# Patient Record
Sex: Female | Born: 1995 | Race: White | Hispanic: No | Marital: Single | Smoking: Never smoker
Health system: Southern US, Community
[De-identification: ages and names within clinical notes are randomized; demographics above are authoritative.]

## PROBLEM LIST (undated history)

## (undated) DIAGNOSIS — E282 Polycystic ovarian syndrome: Secondary | ICD-10-CM

## (undated) DIAGNOSIS — K219 Gastro-esophageal reflux disease without esophagitis: Secondary | ICD-10-CM

## (undated) HISTORY — PX: WISDOM TOOTH EXTRACTION: SHX21

---
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
## 2017-01-14 IMAGING — CT CT ABD-PELV W/ CM
2 of 4 series · 17 of 46 positions shown, 19 images · IV contrast (iopamidol)
Comparison: None.

CLINICAL DATA: Left lower quadrant pain 4 days.  Nausea.

EXAM:
CT ABDOMEN AND PELVIS WITH CONTRAST
TECHNIQUE: Multidetector CT imaging of the abdomen and pelvis was performed
using the standard protocol following bolus administration of
intravenous contrast.
CONTRAST:  100mL 2Y2MDU-BII IOPAMIDOL (2Y2MDU-BII) INJECTION 61%

[Series 2: routine abd pel with · axial · 0.88mm/px · z∈[-256,+224]mm · 14 of 106 slices shown, 16 images]
[im 5/106  soft-tissue]
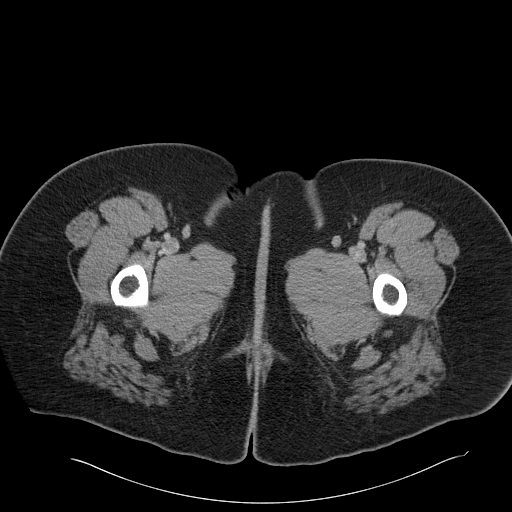
[im 5/106  bone]
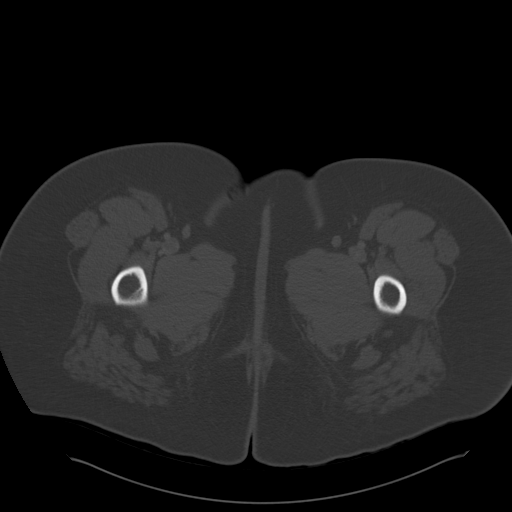
[im 13/106  soft-tissue]
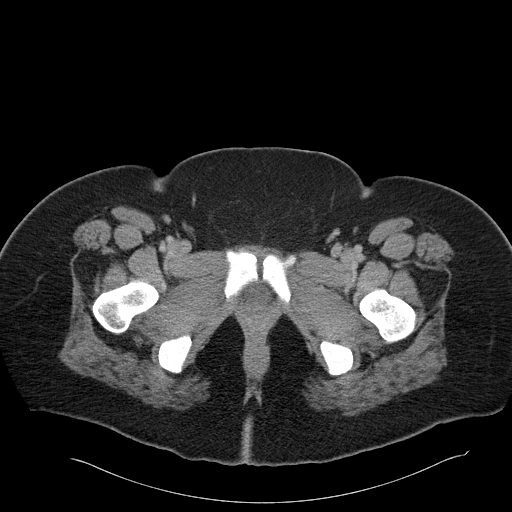
[im 22/106  soft-tissue]
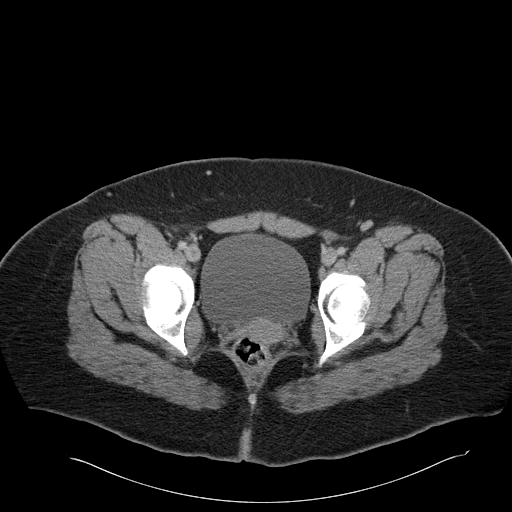
[im 30/106  soft-tissue]
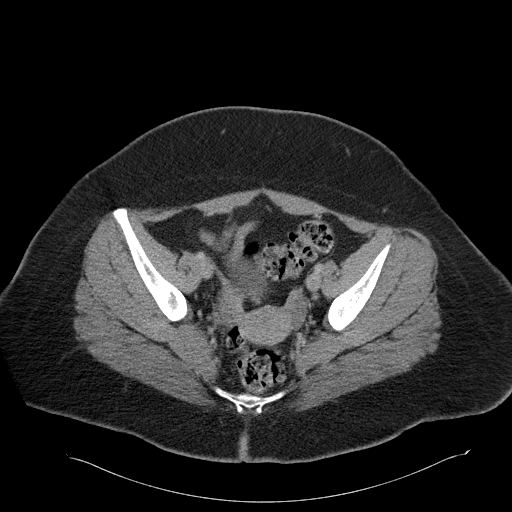
[im 34/106  soft-tissue]
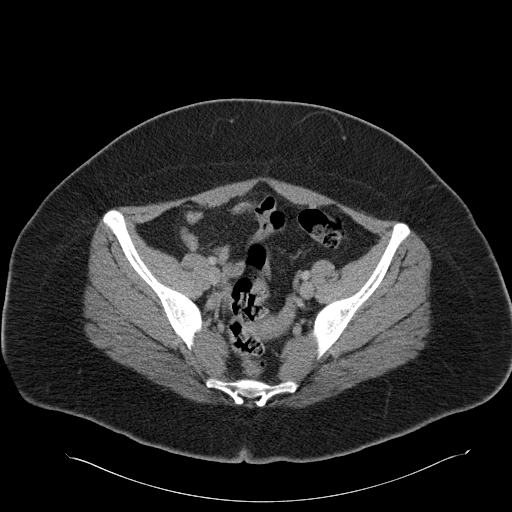
[im 43/106  soft-tissue]
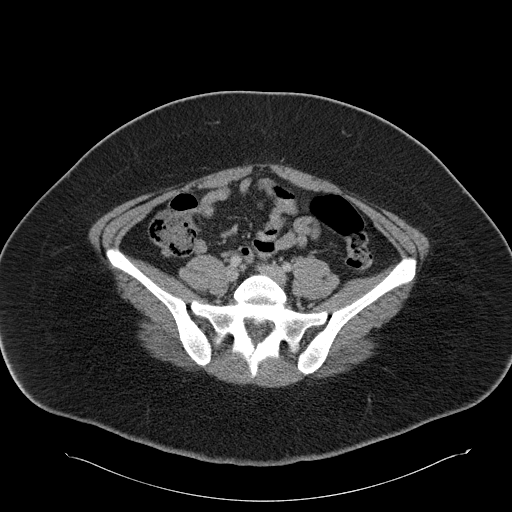
[im 51/106  soft-tissue]
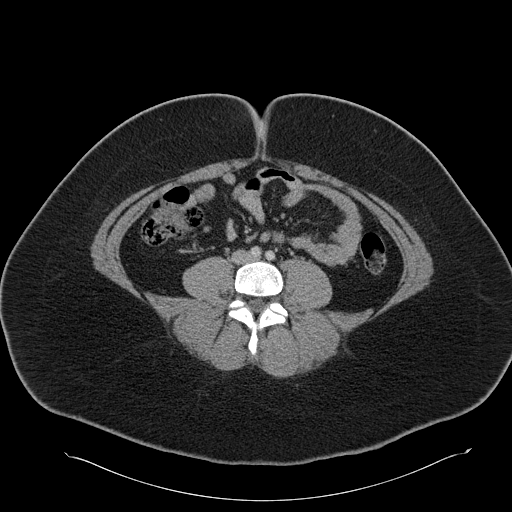
[im 55/106  soft-tissue]
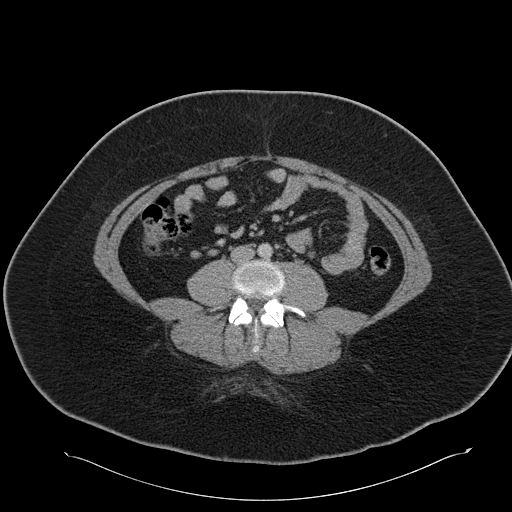
[im 64/106  soft-tissue]
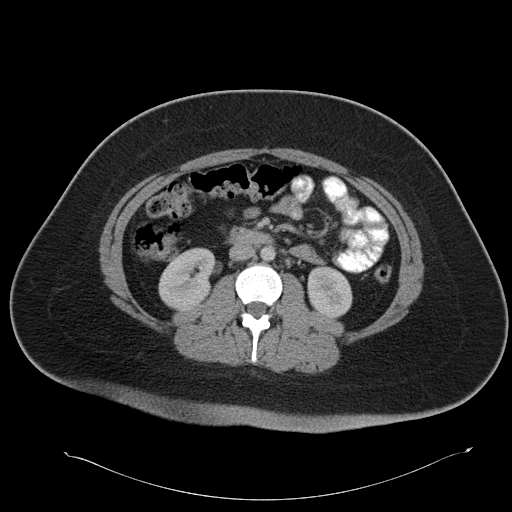
[im 64/106  bone]
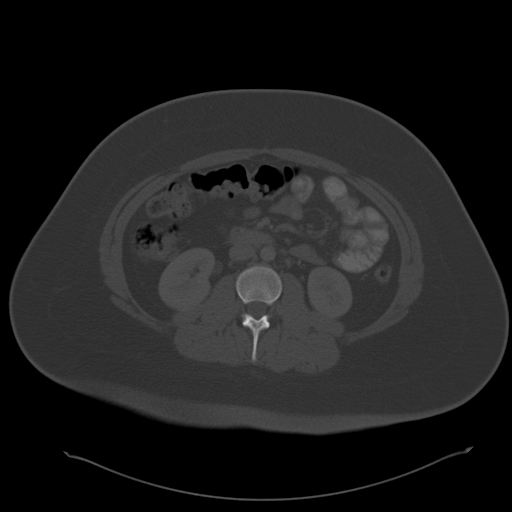
[im 72/106  soft-tissue]
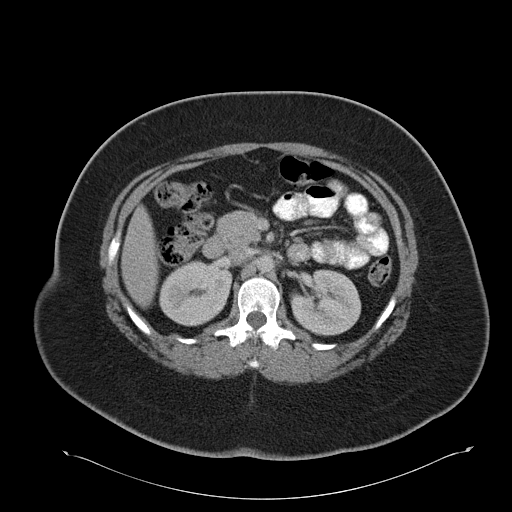
[im 80/106  soft-tissue]
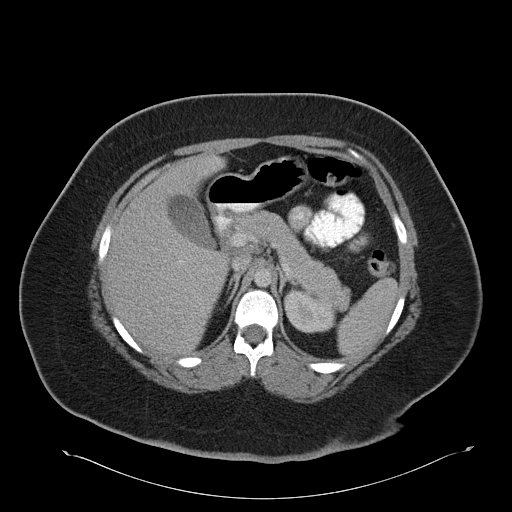
[im 85/106  soft-tissue]
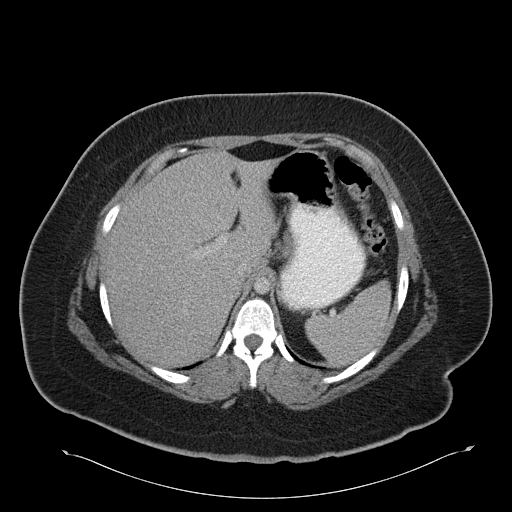
[im 93/106  soft-tissue]
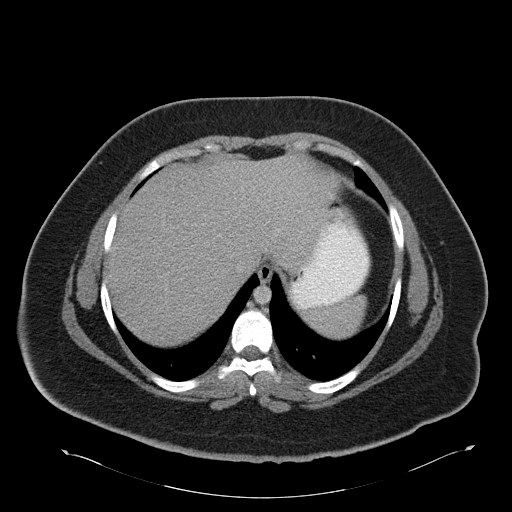
[im 101/106  soft-tissue]
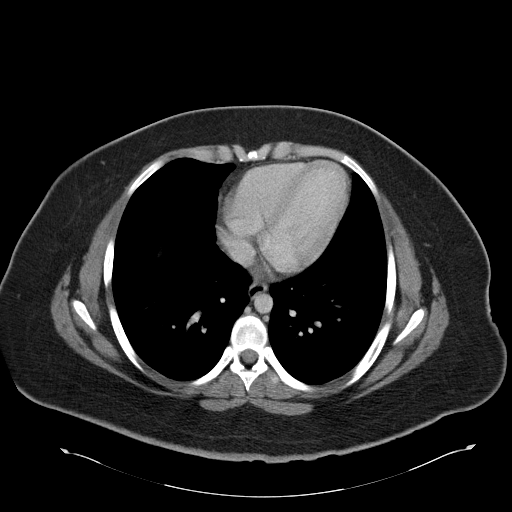

[Series 5: cor routine abd pel with · coronal · 1.09mm/px · 3 of 145 slices shown]
[im 49/145  soft-tissue]
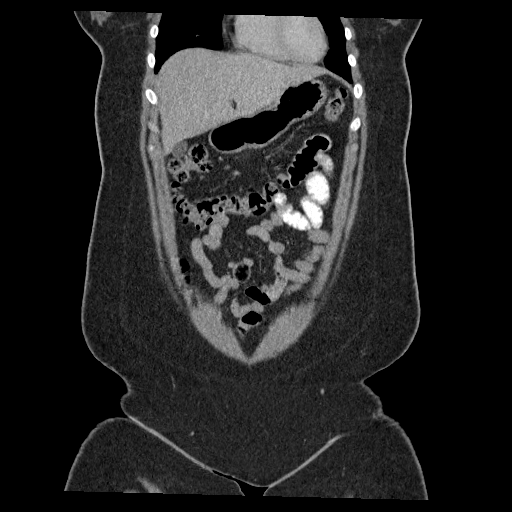
[im 65/145  soft-tissue]
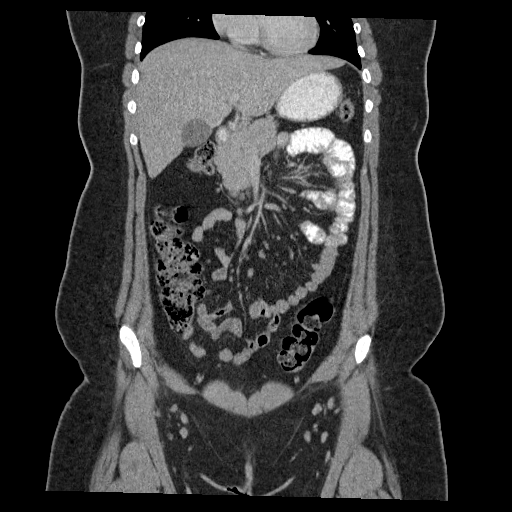
[im 81/145  soft-tissue]
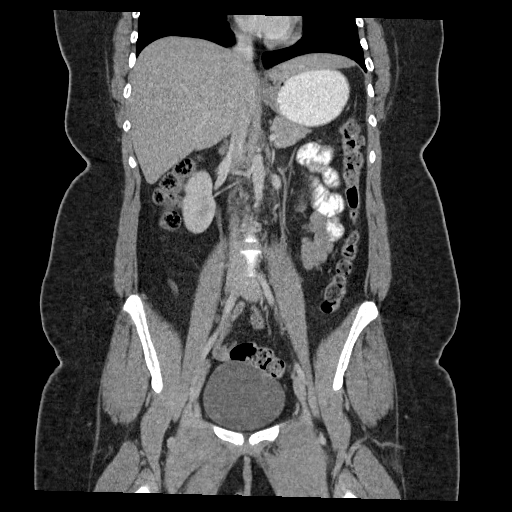

[17 of 46 positions shown; findings below may reference images not displayed]

FINDINGS: Lung bases are within normal.

Abdominal images demonstrate a normal liver, spleen, pancreas,
gallbladder and adrenal glands.

Kidneys are normal in size without hydronephrosis or
nephrolithiasis. Ureters are normal.

Vascular structures are within normal.

Stomach is normal. Appendix is normal. Small bowel and colon are
normal. Mesenteries normal.

Pelvic images demonstrate a normal uterus, ovaries, bladder and
rectum. Remaining bones and soft tissues within normal.
IMPRESSION: No acute findings in the abdomen/pelvis.

## 2017-06-14 DIAGNOSIS — J0391 Acute recurrent tonsillitis, unspecified: Secondary | ICD-10-CM | POA: Insufficient documentation

## 2017-08-14 DIAGNOSIS — F4310 Post-traumatic stress disorder, unspecified: Secondary | ICD-10-CM | POA: Insufficient documentation

## 2017-08-14 DIAGNOSIS — K219 Gastro-esophageal reflux disease without esophagitis: Secondary | ICD-10-CM | POA: Insufficient documentation

## 2018-11-23 DIAGNOSIS — F330 Major depressive disorder, recurrent, mild: Secondary | ICD-10-CM | POA: Insufficient documentation

## 2018-11-23 DIAGNOSIS — F411 Generalized anxiety disorder: Secondary | ICD-10-CM | POA: Insufficient documentation

## 2019-06-25 DIAGNOSIS — E559 Vitamin D deficiency, unspecified: Secondary | ICD-10-CM | POA: Insufficient documentation

## 2019-07-25 DIAGNOSIS — F3161 Bipolar disorder, current episode mixed, mild: Secondary | ICD-10-CM | POA: Insufficient documentation

## 2019-08-20 DIAGNOSIS — E88819 Insulin resistance, unspecified: Secondary | ICD-10-CM | POA: Insufficient documentation

## 2021-08-23 DIAGNOSIS — Z9884 Bariatric surgery status: Secondary | ICD-10-CM | POA: Insufficient documentation

## 2021-09-15 ENCOUNTER — Encounter (HOSPITAL_BASED_OUTPATIENT_CLINIC_OR_DEPARTMENT_OTHER): Payer: Self-pay | Admitting: Emergency Medicine

## 2021-09-15 ENCOUNTER — Other Ambulatory Visit: Payer: Self-pay

## 2021-09-15 DIAGNOSIS — W268XXA Contact with other sharp object(s), not elsewhere classified, initial encounter: Secondary | ICD-10-CM | POA: Diagnosis not present

## 2021-09-15 DIAGNOSIS — Z91040 Latex allergy status: Secondary | ICD-10-CM | POA: Insufficient documentation

## 2021-09-15 DIAGNOSIS — S91115A Laceration without foreign body of left lesser toe(s) without damage to nail, initial encounter: Secondary | ICD-10-CM | POA: Diagnosis not present

## 2021-09-15 DIAGNOSIS — S99922A Unspecified injury of left foot, initial encounter: Secondary | ICD-10-CM | POA: Diagnosis present

## 2021-09-15 NOTE — ED Triage Notes (Signed)
Patient dropped a kitchen blade on her foot, lacerating her left fourth toe.  Bleeding controlled with gauze.  ?

## 2021-09-16 ENCOUNTER — Emergency Department (HOSPITAL_BASED_OUTPATIENT_CLINIC_OR_DEPARTMENT_OTHER)
Admission: EM | Admit: 2021-09-16 | Discharge: 2021-09-16 | Disposition: A | Discharge status: Home / Self Care | Payer: BLUE CROSS/BLUE SHIELD | Attending: Emergency Medicine | Admitting: Emergency Medicine

## 2021-09-16 DIAGNOSIS — S91116A Laceration without foreign body of unspecified lesser toe(s) without damage to nail, initial encounter: Secondary | ICD-10-CM

## 2021-09-16 NOTE — ED Notes (Signed)
Patient's foot currently soaking in half saline, half iodine per MD Palumbo.  Dermabond at bedside. ?

## 2021-09-16 NOTE — ED Provider Notes (Signed)
?[facility] EMERGENCY DEPT ?Provider Note ? ? ?CSN: [id number] ?Arrival date & time: 09/15/21  2142 ? ?  ? ?History ? ?Chief Complaint  ?Patient presents with  ? Laceration  ? ? ?April Shea is a 26 y.o. female. ? ?The history is provided by the patient.  ?Laceration ?Location:  Toe ?Toe laceration location:  L fourth toe ?Length:  0.9 ?Depth:  Cutaneous ?Quality: straight   ?Bleeding: controlled   ?Time since incident:  4 hours ?Laceration mechanism:  Metal edge ?Pain details:  ?  Quality:  Aching ?  Severity:  Mild ?  Timing:  Constant ?  Progression:  Unchanged ?Foreign body present:  No foreign bodies ?Relieved by:  Nothing ?Worsened by:  Nothing ?Ineffective treatments:  None tried ?Tetanus status:  Up to date ?Associated symptoms: no fever   ? ?  ? ?Home Medications ?Prior to Admission medications   ?Not on File  ?   ? ?Allergies    ?Latex   ? ?Review of Systems   ?Review of Systems  ?Constitutional:  Negative for fever.  ?HENT:  Negative for facial swelling.   ?Eyes:  Negative for redness.  ?Respiratory:  Negative for wheezing and stridor.   ?Cardiovascular:  Negative for chest pain.  ?Gastrointestinal:  Negative for abdominal pain.  ?Genitourinary:  Negative for difficulty urinating.  ?Musculoskeletal:  Negative for neck stiffness.  ?Skin:  Negative for color change.  ?Neurological:  Negative for facial asymmetry.  ?Psychiatric/Behavioral:  Negative for agitation.   ?All other systems reviewed and are negative. ? ?Physical Exam ?Updated Vital Signs ?BP 130/80 (BP Location: Right Arm)   Pulse 74   Temp 98.4 ?F (36.9 ?C) (Oral)   Resp 18   Ht 5\' 7"  (1.702 m)   Wt 117 kg   SpO2 99%   BMI 40.41 kg/m?  ?Physical Exam ?Vitals and nursing note reviewed.  ?Constitutional:   ?   General: She is not in acute distress. ?   Appearance: Normal appearance.  ?HENT:  ?   Head: Normocephalic and atraumatic.  ?   Nose: Nose normal.  ?Eyes:  ?   Conjunctiva/sclera: Conjunctivae normal.  ?   Pupils: Pupils  are equal, round, and reactive to light.  ?Cardiovascular:  ?   Rate and Rhythm: Normal rate and regular rhythm.  ?   Pulses: Normal pulses.  ?   Heart sounds: Normal heart sounds.  ?Pulmonary:  ?   Effort: Pulmonary effort is normal.  ?   Breath sounds: Normal breath sounds.  ?Abdominal:  ?   General: Bowel sounds are normal.  ?   Palpations: Abdomen is soft.  ?   Tenderness: There is no abdominal tenderness. There is no guarding.  ?Musculoskeletal:     ?   General: Normal range of motion.  ?   Cervical back: Normal range of motion and neck supple.  ?Skin: ?   General: Skin is warm and dry.  ?   Capillary Refill: Capillary refill takes less than 2 seconds.  ?Neurological:  ?   General: No focal deficit present.  ?   Mental Status: She is alert and oriented to person, place, and time.  ?   Deep Tendon Reflexes: Reflexes normal.  ?Psychiatric:     ?   Mood and Affect: Mood normal.     ?   Behavior: Behavior normal.  ? ? ?ED Results / Procedures / Treatments   ?Labs ?(all labs ordered are listed, but only abnormal results are displayed) ?Labs Reviewed -  No data to display ? ?EKG ?None ? ?Radiology ?No results found. ? ?Procedures ?Marland Kitchen.Laceration Repair ? ?Date/Time: 09/16/2021 1:51 AM ?Performed by: Cy Blamer, MD ?Authorized by: Cy Blamer, MD  ? ?Consent:  ?  Consent obtained:  Verbal ?  Consent given by:  Patient ?  Risks discussed:  Infection, need for additional repair, nerve damage, pain, poor cosmetic result and poor wound healing ?  Alternatives discussed:  No treatment ?Universal protocol:  ?  Procedure explained and questions answered to patient or proxy's satisfaction: yes   ?  Patient identity confirmed:  Arm band ?Anesthesia:  ?  Anesthesia method:  None ?Laceration details:  ?  Location:  Toe ?  Toe location:  L fourth toe ?  Length (cm):  0.9 ?  Depth (mm):  0.5 ?Pre-procedure details:  ?  Preparation:  Patient was prepped and draped in usual sterile fashion ?Exploration:  ?  Hemostasis achieved  with:  Cautery ?  Imaging outcome: foreign body noted   ?  Wound exploration: wound explored through full range of motion   ?  Wound extent: no areolar tissue violation noted   ?Treatment:  ?  Area cleansed with:  Povidone-iodine, chlorhexidine and saline ?  Amount of cleaning:  Extensive ?  Irrigation solution:  Sterile saline ?  Irrigation method:  Syringe ?  Debridement:  None ?Skin repair:  ?  Repair method:  Tissue adhesive ?Approximation:  ?  Approximation:  Close ?Repair type:  ?  Repair type:  Simple ?Post-procedure details:  ?  Dressing:  Sterile dressing ?  Procedure completion:  Tolerated well, no immediate complications  ? ? ?Medications Ordered in ED ?Medications - No data to display ? ?ED Course/ Medical Decision Making/ A&P ?  ?                        ?Medical Decision Making ?Laceration to the left 4th toe on metal blade.  Tatenus UTD ? ?Amount and/or Complexity of Data Reviewed ?Independent Historian: spouse ?   Details: see above ? ?Risk ?Prescription drug management. ?Risk Details: Laceration repaired no submersion for 2 weeks.   ? ? ? ?Final Clinical Impression(s) / ED Diagnoses ?Final diagnoses:  ?None  ? ?Return for intractable cough, coughing up blood, fevers > 100.4 unrelieved by medication, shortness of breath, intractable vomiting, chest pain, shortness of breath, weakness, numbness, changes in speech, facial asymmetry, abdominal pain, passing out, Inability to tolerate liquids or food, cough, altered mental status or any concerns. No signs of systemic illness or infection. The patient is nontoxic-appearing on exam and vital signs are within normal limits.  ?I have reviewed the triage vital signs and the nursing notes. Pertinent labs & imaging results that were available during my care of the patient were reviewed by me and considered in my medical decision making (see chart for details). After history, exam, and medical workup I feel the patient has been appropriately medically screened  and is safe for discharge home. Pertinent diagnoses were discussed with the patient. Patient was given return precautions.  ?Rx / DC Orders ?ED Discharge Orders   ? ? None  ? ?  ? ? ?  ?Melvina Pangelinan, MD ?09/16/21 0153 ? ?

## 2021-09-16 NOTE — ED Notes (Signed)
Patient's wound dressed with bulky dressing.  ?

## 2021-11-09 DIAGNOSIS — F419 Anxiety disorder, unspecified: Secondary | ICD-10-CM | POA: Insufficient documentation

## 2021-11-09 DIAGNOSIS — Z975 Presence of (intrauterine) contraceptive device: Secondary | ICD-10-CM | POA: Insufficient documentation

## 2021-11-09 DIAGNOSIS — N921 Excessive and frequent menstruation with irregular cycle: Secondary | ICD-10-CM | POA: Insufficient documentation

## 2022-04-18 ENCOUNTER — Ambulatory Visit (INDEPENDENT_AMBULATORY_CARE_PROVIDER_SITE_OTHER): Payer: BC Managed Care – PPO | Admitting: Podiatry

## 2022-04-18 DIAGNOSIS — B070 Plantar wart: Secondary | ICD-10-CM

## 2022-04-18 NOTE — Progress Notes (Signed)
   Chief Complaint  Patient presents with   Plantar Warts    Patient is here for left foot plantar wart.the patient states that she has had for 13 years.    Subjective: 26 y.o. female presenting today as a new patient for evaluation of plantar verruca to the left forefoot x13 years.  Patient states that she has been seen by her primary care and dermatology and they have tried freezing it off multiple occasions with no improvement.  She presents for further treatment and evaluation   Past Medical History:  Diagnosis Date   Acid reflux    PCOS (polycystic ovarian syndrome)     Objective: Physical Exam General: The patient is alert and oriented x3 in no acute distress.   Dermatology: Hyperkeratotic skin lesion(s) noted to the plantar aspect of the left foot approximately 1 cm in diameter. Pinpoint bleeding noted upon debridement. Skin is warm, dry and supple bilateral lower extremities. Negative for open lesions or macerations.   Vascular: Palpable pedal pulses bilaterally. No edema or erythema noted. Capillary refill within normal limits.   Neurological: Epicritic and protective threshold grossly intact bilaterally.    Musculoskeletal Exam: Pain on palpation to the noted skin lesion(s).  Range of motion within normal limits to all pedal and ankle joints bilateral. Muscle strength 5/5 in all groups bilateral.    Assessment: #1 plantar wart left foot   Plan of Care:  #1 Patient was evaluated. #2 Excisional debridement of the plantar wart lesion(s) was performed using a chisel blade. Cantharone was applied and the lesion(s) was dressed with a dry sterile dressing. #3 patient is to return to clinic in 2 weeks  *Mental health therapist  Edrick Kins, DPM [facility]  Dr. Edrick Kins, DPM    [address], [place] [address]                Office [phone]  Fax [phone]

## 2022-05-11 ENCOUNTER — Encounter: Payer: Self-pay | Admitting: Podiatry

## 2022-05-11 ENCOUNTER — Ambulatory Visit (INDEPENDENT_AMBULATORY_CARE_PROVIDER_SITE_OTHER): Payer: BC Managed Care – PPO | Admitting: Podiatry

## 2022-05-11 DIAGNOSIS — B070 Plantar wart: Secondary | ICD-10-CM | POA: Diagnosis not present

## 2022-05-11 DIAGNOSIS — Z6841 Body Mass Index (BMI) 40.0 and over, adult: Secondary | ICD-10-CM | POA: Insufficient documentation

## 2022-05-11 NOTE — Progress Notes (Signed)
   Chief Complaint  Patient presents with   Plantar Warts    Subjective: 26 y.o. female presenting today for follow-up evaluation of plantar verruca to the left forefoot x13 years.  Patient states that she has been seen by her primary care and dermatology and they have tried freezing it off multiple occasions with no improvement.    Last visit 04/18/2022 Cantharone was applied.  Patient states that she did have pain and tenderness for about 5-6 days and then the pain resolved.  Overall she feels much better today.  she presents for further treatment and evaluation   Past Medical History:  Diagnosis Date   Acid reflux    PCOS (polycystic ovarian syndrome)     Objective: Physical Exam General: The patient is alert and oriented x3 in no acute distress.   Dermatology: There continues to be a hyperkeratotic skin lesion(s) noted to the plantar aspect of the left foot.  Pinpoint bleeding noted upon debridement. Skin is warm, dry and supple bilateral lower extremities. Negative for open lesions or macerations.   Vascular: Palpable pedal pulses bilaterally. No edema or erythema noted. Capillary refill within normal limits.   Neurological: Epicritic and protective threshold grossly intact bilaterally.    Musculoskeletal Exam: Pain on palpation to the noted skin lesion(s).  Range of motion within normal limits to all pedal and ankle joints bilateral. Muscle strength 5/5 in all groups bilateral.    Assessment: #1 plantar wart left foot   Plan of Care:  #1 Patient was evaluated. #2 Excisional debridement of the plantar wart lesion(s) was performed using a chisel blade.  Salicylic acid was applied and the lesion(s) was dressed with a dry sterile dressing.  Additional salicylic acid provided to apply daily #3 patient is to return to clinic in 4 weeks  *Mental health therapist  Felecia Shelling, DPM [facility]  Dr. Felecia Shelling, DPM    [address] [place], [place] [address]                Office [phone]  Fax [phone]

## 2022-06-08 ENCOUNTER — Ambulatory Visit (INDEPENDENT_AMBULATORY_CARE_PROVIDER_SITE_OTHER): Payer: BC Managed Care – PPO | Admitting: Podiatry

## 2022-06-08 DIAGNOSIS — B070 Plantar wart: Secondary | ICD-10-CM | POA: Diagnosis not present

## 2022-06-08 NOTE — Progress Notes (Signed)
   Chief Complaint  Patient presents with   Plantar Fasciitis    Patient is here follow-up for left foot plantar wart.     Subjective: 26 y.o. female presenting today for follow-up evaluation of plantar verruca to the left forefoot x13 years.  Patient states that she has been seen by her primary care and dermatology and they have tried freezing it off multiple occasions with no improvement.    Last visit 04/18/2022 Barrington Ellison was applied and over the past month she has been applying Salicylic acid. Overall improvement.    Past Medical History:  Diagnosis Date   Acid reflux    PCOS (polycystic ovarian syndrome)     Objective: Physical Exam General: The patient is alert and oriented x3 in no acute distress.   Dermatology: There continues to be a macerated hyperkeratotic skin lesion(s) noted to the plantar aspect of the left foot.  Pinpoint bleeding noted upon debridement. Skin is warm, dry and supple bilateral lower extremities.    Vascular: Palpable pedal pulses bilaterally. No edema or erythema noted. Capillary refill within normal limits.   Neurological: Grossly intact via light touch   Musculoskeletal Exam: Pain on palpation to the noted skin lesion(s).  No other pedal deformity   Assessment: #1 plantar wart left foot   Plan of Care:  #1 Patient was evaluated. #2 Excisional debridement of the plantar wart lesion(s) was performed using a chisel blade and tissue nipper.  Salicylic acid was applied and the lesion(s) was dressed with a dry sterile dressing. Conitnue salicylic acid daily #3 return to clinic in 4 weeks  *Mental health therapist  Felecia Shelling, DPM [facility]  Dr. Felecia Shelling, DPM    [address] [place], [place] [address]                Office [phone]  Fax [phone]

## 2022-07-25 ENCOUNTER — Ambulatory Visit (INDEPENDENT_AMBULATORY_CARE_PROVIDER_SITE_OTHER): Payer: BC Managed Care – PPO | Admitting: Podiatry

## 2022-07-25 DIAGNOSIS — Z91199 Patient's noncompliance with other medical treatment and regimen due to unspecified reason: Secondary | ICD-10-CM

## 2022-07-25 NOTE — Progress Notes (Signed)
   Complete physical exam  Patient: April Shea   DOB: 04/02/1997   27 y.o. Female  MRN: [id number]  Subjective:    No chief complaint on file.   April Shea is a 27 y.o. female who presents today for a complete physical exam. She reports consuming a {diet types:17450} diet. {types:19826} She generally feels {DESC; WELL/FAIRLY WELL/POORLY:18703}. She reports sleeping {DESC; WELL/FAIRLY WELL/POORLY:18703}. She {does/does not:200015} have additional problems to discuss today.    Most recent fall risk assessment:    12/08/2021   10:42 AM  Fall Risk   Falls in the past year? 0  Number falls in past yr: 0  Injury with Fall? 0  Risk for fall due to : No Fall Risks  Follow up Falls evaluation completed     Most recent depression screenings:    12/08/2021   10:42 AM 10/29/2020   10:46 AM  PHQ 2/9 Scores  PHQ - 2 Score 0 0  PHQ- 9 Score 5     {VISON DENTAL STD PSA (Optional):27386}  {History (Optional):23778}  Patient Care Team: Jessup, Joy, NP as PCP - General (Nurse Practitioner)   Outpatient Medications Prior to Visit  Medication Sig   fluticasone (FLONASE) 50 MCG/ACT nasal spray Place 2 sprays into both nostrils in the morning and at bedtime. After 7 days, reduce to once daily.   norgestimate-ethinyl estradiol (SPRINTEC 28) 0.25-35 MG-MCG tablet Take 1 tablet by mouth daily.   Nystatin POWD Apply liberally to affected area 2 times per day   spironolactone (ALDACTONE) 100 MG tablet Take 1 tablet (100 mg total) by mouth daily.   No facility-administered medications prior to visit.    ROS        Objective:     There were no vitals taken for this visit. {Vitals History (Optional):23777}  Physical Exam   No results found for any visits on 01/13/22. {Show previous labs (optional):23779}    Assessment & Plan:    Routine Health Maintenance and Physical Exam  Immunization History  Administered Date(s) Administered   DTaP 06/16/1999, 08/12/1999,  10/21/1999, 07/06/2000, 01/20/2004   Hepatitis A 11/16/2007, 11/21/2008   Hepatitis B 04/03/1999, 05/11/1999, 10/21/1999   HiB (PRP-OMP) 06/16/1999, 08/12/1999, 10/21/1999, 07/06/2000   IPV 06/16/1999, 08/12/1999, 04/10/2000, 01/20/2004   Influenza,inj,Quad PF,6+ Mos 02/21/2014   Influenza-Unspecified 05/23/2012   MMR 04/10/2001, 01/20/2004   Meningococcal Polysaccharide 11/21/2011   Pneumococcal Conjugate-13 07/06/2000   Pneumococcal-Unspecified 10/21/1999, 01/04/2000   Tdap 11/21/2011   Varicella 04/10/2000, 11/16/2007    Health Maintenance  Topic Date Due   HIV Screening  Never done   Hepatitis C Screening  Never done   INFLUENZA VACCINE  01/11/2022   PAP-Cervical Cytology Screening  01/13/2022 (Originally 04/01/2020)   PAP SMEAR-Modifier  01/13/2022 (Originally 04/01/2020)   TETANUS/TDAP  01/13/2022 (Originally 11/20/2021)   HPV VACCINES  Discontinued   COVID-19 Vaccine  Discontinued    Discussed health benefits of physical activity, and encouraged her to engage in regular exercise appropriate for her age and condition.  Problem List Items Addressed This Visit   None Visit Diagnoses     Annual physical exam    -  Primary   Cervical cancer screening       Need for Tdap vaccination          No follow-ups on file.     Joy Jessup, NP
